# Patient Record
Sex: Male | Born: 1980 | Race: White | Hispanic: No | Marital: Single | Smoking: Current every day smoker
Health system: Southern US, Community
[De-identification: ages and names within clinical notes are randomized; demographics above are authoritative.]

## PROBLEM LIST (undated history)

## (undated) DIAGNOSIS — I10 Essential (primary) hypertension: Secondary | ICD-10-CM

## (undated) DIAGNOSIS — M5410 Radiculopathy, site unspecified: Secondary | ICD-10-CM

## (undated) DIAGNOSIS — M542 Cervicalgia: Secondary | ICD-10-CM

## (undated) DIAGNOSIS — M549 Dorsalgia, unspecified: Secondary | ICD-10-CM

## (undated) DIAGNOSIS — G8929 Other chronic pain: Secondary | ICD-10-CM

## (undated) HISTORY — PX: TRACHEOSTOMY: SUR1362

## (undated) HISTORY — PX: CARPAL TUNNEL RELEASE: SHX101

## (undated) HISTORY — PX: ELBOW SURGERY: SHX618

---

## 2006-06-16 ENCOUNTER — Emergency Department (HOSPITAL_COMMUNITY): Admission: EM | Admit: 2006-06-16 | Discharge: 2006-06-16 | Payer: Self-pay | Admitting: Emergency Medicine

## 2006-10-06 ENCOUNTER — Emergency Department (HOSPITAL_COMMUNITY): Admission: EM | Admit: 2006-10-06 | Discharge: 2006-10-06 | Payer: Self-pay | Admitting: Emergency Medicine

## 2006-10-12 ENCOUNTER — Emergency Department (HOSPITAL_COMMUNITY): Admission: EM | Admit: 2006-10-12 | Discharge: 2006-10-12 | Payer: Self-pay | Admitting: Emergency Medicine

## 2007-02-01 ENCOUNTER — Emergency Department (HOSPITAL_COMMUNITY): Admission: EM | Admit: 2007-02-01 | Discharge: 2007-02-01 | Payer: Self-pay | Admitting: Emergency Medicine

## 2007-02-03 ENCOUNTER — Emergency Department (HOSPITAL_COMMUNITY): Admission: EM | Admit: 2007-02-03 | Discharge: 2007-02-03 | Payer: Self-pay | Admitting: Emergency Medicine

## 2007-02-20 ENCOUNTER — Emergency Department (HOSPITAL_COMMUNITY): Admission: EM | Admit: 2007-02-20 | Discharge: 2007-02-20 | Payer: Self-pay | Admitting: Emergency Medicine

## 2007-03-05 ENCOUNTER — Emergency Department (HOSPITAL_COMMUNITY): Admission: EM | Admit: 2007-03-05 | Discharge: 2007-03-05 | Payer: Self-pay | Admitting: Emergency Medicine

## 2007-05-11 ENCOUNTER — Other Ambulatory Visit: Payer: Self-pay | Admitting: Emergency Medicine

## 2007-05-12 ENCOUNTER — Inpatient Hospital Stay (HOSPITAL_COMMUNITY): Admission: EM | Admit: 2007-05-12 | Discharge: 2007-05-13 | Payer: Self-pay | Admitting: Psychiatry

## 2007-05-14 ENCOUNTER — Ambulatory Visit: Payer: Self-pay | Admitting: Psychiatry

## 2007-07-21 ENCOUNTER — Emergency Department (HOSPITAL_COMMUNITY): Admission: EM | Admit: 2007-07-21 | Discharge: 2007-07-21 | Payer: Self-pay | Admitting: Emergency Medicine

## 2007-12-23 ENCOUNTER — Emergency Department (HOSPITAL_COMMUNITY): Admission: EM | Admit: 2007-12-23 | Discharge: 2007-12-23 | Payer: Self-pay | Admitting: Emergency Medicine

## 2008-05-26 ENCOUNTER — Emergency Department (HOSPITAL_COMMUNITY): Admission: EM | Admit: 2008-05-26 | Discharge: 2008-05-26 | Payer: Self-pay | Admitting: Emergency Medicine

## 2008-05-27 ENCOUNTER — Emergency Department (HOSPITAL_COMMUNITY): Admission: EM | Admit: 2008-05-27 | Discharge: 2008-05-27 | Payer: Self-pay | Admitting: Emergency Medicine

## 2008-05-31 ENCOUNTER — Emergency Department (HOSPITAL_COMMUNITY): Admission: EM | Admit: 2008-05-31 | Discharge: 2008-05-31 | Payer: Self-pay | Admitting: Emergency Medicine

## 2008-06-11 ENCOUNTER — Encounter: Payer: Self-pay | Admitting: Orthopedic Surgery

## 2008-11-03 ENCOUNTER — Ambulatory Visit (HOSPITAL_COMMUNITY): Admission: RE | Admit: 2008-11-03 | Discharge: 2008-11-03 | Payer: Self-pay | Admitting: Anesthesiology

## 2009-02-17 ENCOUNTER — Emergency Department (HOSPITAL_COMMUNITY): Admission: EM | Admit: 2009-02-17 | Discharge: 2009-02-17 | Payer: Self-pay | Admitting: Emergency Medicine

## 2009-02-23 ENCOUNTER — Emergency Department (HOSPITAL_COMMUNITY): Admission: EM | Admit: 2009-02-23 | Discharge: 2009-02-23 | Payer: Self-pay | Admitting: Emergency Medicine

## 2009-05-07 ENCOUNTER — Emergency Department (HOSPITAL_COMMUNITY): Admission: EM | Admit: 2009-05-07 | Discharge: 2009-05-07 | Payer: Self-pay | Admitting: Emergency Medicine

## 2009-06-23 ENCOUNTER — Emergency Department (HOSPITAL_COMMUNITY): Admission: EM | Admit: 2009-06-23 | Discharge: 2009-06-24 | Payer: Self-pay | Admitting: Emergency Medicine

## 2009-10-21 ENCOUNTER — Inpatient Hospital Stay (HOSPITAL_COMMUNITY): Admission: EM | Admit: 2009-10-21 | Discharge: 2009-10-23 | Payer: Self-pay | Admitting: Emergency Medicine

## 2009-10-22 ENCOUNTER — Ambulatory Visit: Payer: Self-pay | Admitting: Internal Medicine

## 2009-10-22 ENCOUNTER — Encounter: Payer: Self-pay | Admitting: Internal Medicine

## 2009-10-22 ENCOUNTER — Encounter (INDEPENDENT_AMBULATORY_CARE_PROVIDER_SITE_OTHER): Payer: Self-pay

## 2009-10-23 ENCOUNTER — Ambulatory Visit: Payer: Self-pay | Admitting: Internal Medicine

## 2009-10-27 ENCOUNTER — Encounter: Payer: Self-pay | Admitting: Internal Medicine

## 2010-03-23 NOTE — Letter (Signed)
Summary: CONSULTATION  CONSULTATION   Imported By: Rexene Alberts 10/22/2009 15:09:06  _____________________________________________________________________  External Attachment:    Type:   Image     Comment:   External Document

## 2010-03-23 NOTE — Letter (Signed)
Summary: Patient Notice, Endo Biopsy Results  [facility]  [address]   [place], [place] [address]   Phone: [phone]  Fax: [phone]       October 27, 2009   [facility] [address] [place], [place]  [address] 05/24/1980    Dear Mr. Steve Pearson,  I am pleased to inform you that the biopsies taken during your recent endoscopic examination did not show any evidence of cancer or infection upon pathologic examination.  There was mild inflammation.  Additional information/recommendations:  No further action is needed at this time.  Please follow-up with your primary care physician for your other healthcare needs.  Continue with the treatment plan as outlined on the day of your exam.  Please call us if you are having persistent problems or have questions about your condition that have not been fully answered at this time.  Sincerely,    R. Roetta Sessions MD, FACP [facility] Ph: [phone]   Fax: [phone]   Appended Document: Patient Notice, Endo Biopsy Results Letter mailed to pt.

## 2010-05-06 LAB — BASIC METABOLIC PANEL
CO2: 28 mEq/L (ref 19–32)
Chloride: 103 mEq/L (ref 96–112)
GFR calc Af Amer: >60 mL/min (ref >60)
Glucose, Bld: 95 mg/dL (ref 70–99)
Potassium: 3.8 mEq/L (ref 3.5–5.1)

## 2010-05-06 LAB — DIFFERENTIAL
Basophils Relative: 0 % (ref 0–1)
Eosinophils Absolute: 0.2 10*3/uL (ref 0.0–0.7)
Lymphs Abs: 2.1 10*3/uL (ref 0.7–4.0)
Monocytes Absolute: 0.9 10*3/uL (ref 0.1–1.0)
Monocytes Relative: 12 % (ref 3–12)

## 2010-05-06 LAB — APTT: aPTT: 39 seconds — ABNORMAL HIGH (ref 24–37)

## 2010-05-06 LAB — PROTIME-INR: INR: 1.08 (ref 0.00–1.49)

## 2010-05-06 LAB — CBC
MCH: 31.2 pg (ref 26.0–34.0)
MCHC: 33.8 g/dL (ref 30.0–36.0)
RBC: 3.96 MIL/uL — ABNORMAL LOW (ref 4.22–5.81)
WBC: 7.7 10*3/uL (ref 4.0–10.5)

## 2010-05-07 LAB — DIFFERENTIAL
Lymphs Abs: 2.4 10*3/uL (ref 0.7–4.0)
Monocytes Relative: 9 % (ref 3–12)
Neutro Abs: 8.4 10*3/uL — ABNORMAL HIGH (ref 1.7–7.7)
Neutrophils Relative %: 70 % (ref 43–77)

## 2010-05-07 LAB — URINE CULTURE
Colony Count: NO GROWTH
Culture  Setup Time: 201108312108

## 2010-05-07 LAB — URINALYSIS, ROUTINE W REFLEX MICROSCOPIC
Bilirubin Urine: NEGATIVE
Glucose, UA: NEGATIVE mg/dL
Hgb urine dipstick: NEGATIVE
Ketones, ur: NEGATIVE mg/dL
Leukocytes, UA: NEGATIVE
Specific Gravity, Urine: 1.025 (ref 1.005–1.030)
pH: 6.5 (ref 5.0–8.0)

## 2010-05-07 LAB — COMPREHENSIVE METABOLIC PANEL
ALT: 43 U/L (ref 0–53)
CO2: 27 mEq/L (ref 19–32)
Calcium: 9.4 mg/dL (ref 8.4–10.5)
Creatinine, Ser: 0.69 mg/dL (ref 0.4–1.5)
GFR calc Af Amer: >60 mL/min (ref >60)
Glucose, Bld: 95 mg/dL (ref 70–99)

## 2010-05-07 LAB — CBC
Hemoglobin: 13.4 g/dL (ref 13.0–17.0)
MCV: 91.6 fL (ref 78.0–100.0)
RDW: 12.7 % (ref 11.5–15.5)

## 2010-05-11 LAB — URINALYSIS, ROUTINE W REFLEX MICROSCOPIC
Ketones, ur: NEGATIVE mg/dL
Nitrite: NEGATIVE
Protein, ur: 30 mg/dL — AB
Specific Gravity, Urine: >1.03 — ABNORMAL HIGH (ref 1.005–1.030)
Urobilinogen, UA: 1 mg/dL (ref 0.0–1.0)

## 2010-05-11 LAB — URINE MICROSCOPIC-ADD ON

## 2010-05-11 LAB — CBC
HCT: 42.5 % (ref 39.0–52.0)
MCV: 90.6 fL (ref 78.0–100.0)
RBC: 4.69 MIL/uL (ref 4.22–5.81)
WBC: 9.4 10*3/uL (ref 4.0–10.5)

## 2010-05-11 LAB — DIFFERENTIAL
Basophils Absolute: 0.1 10*3/uL (ref 0.0–0.1)
Eosinophils Relative: 1 % (ref 0–5)
Lymphocytes Relative: 42 % (ref 12–46)

## 2010-05-11 LAB — LIPASE, BLOOD: Lipase: 31 U/L (ref 11–59)

## 2010-05-11 LAB — COMPREHENSIVE METABOLIC PANEL
AST: 21 U/L (ref 0–37)
BUN: 6 mg/dL (ref 6–23)
CO2: 28 mEq/L (ref 19–32)
Chloride: 104 mEq/L (ref 96–112)
Creatinine, Ser: 0.71 mg/dL (ref 0.4–1.5)
GFR calc non Af Amer: >60 mL/min (ref >60)
Total Bilirubin: 0.6 mg/dL (ref 0.3–1.2)

## 2010-06-02 LAB — URINALYSIS, ROUTINE W REFLEX MICROSCOPIC
Glucose, UA: NEGATIVE mg/dL
Hgb urine dipstick: NEGATIVE
Specific Gravity, Urine: 1.015 (ref 1.005–1.030)
pH: 7 (ref 5.0–8.0)

## 2010-06-02 LAB — GC/CHLAMYDIA PROBE AMP, GENITAL
Chlamydia, DNA Probe: NEGATIVE
GC Probe Amp, Genital: NEGATIVE

## 2010-06-12 ENCOUNTER — Emergency Department (HOSPITAL_COMMUNITY): Payer: Medicaid Other

## 2010-06-12 ENCOUNTER — Emergency Department (HOSPITAL_COMMUNITY)
Admission: EM | Admit: 2010-06-12 | Discharge: 2010-06-12 | Disposition: A | Discharge status: Home / Self Care | Payer: Medicaid Other | Attending: Emergency Medicine | Admitting: Emergency Medicine

## 2010-06-12 DIAGNOSIS — R109 Unspecified abdominal pain: Secondary | ICD-10-CM | POA: Insufficient documentation

## 2010-06-12 LAB — COMPREHENSIVE METABOLIC PANEL WITH GFR
ALT: 17 U/L (ref 0–53)
AST: 23 U/L (ref 0–37)
Albumin: 4.1 g/dL (ref 3.5–5.2)
Alkaline Phosphatase: 79 U/L (ref 39–117)
BUN: 8 mg/dL (ref 6–23)
CO2: 26 meq/L (ref 19–32)
Calcium: 9.2 mg/dL (ref 8.4–10.5)
Chloride: 98 meq/L (ref 96–112)
Creatinine, Ser: 0.67 mg/dL (ref 0.4–1.5)
GFR calc non Af Amer: >60 mL/min
Glucose, Bld: 92 mg/dL (ref 70–99)
Potassium: 3.7 meq/L (ref 3.5–5.1)
Sodium: 138 meq/L (ref 135–145)
Total Bilirubin: 0.7 mg/dL (ref 0.3–1.2)
Total Protein: 7.1 g/dL (ref 6.0–8.3)

## 2010-06-12 LAB — CBC
HCT: 43 % (ref 39.0–52.0)
Hemoglobin: 15 g/dL (ref 13.0–17.0)
MCH: 31.2 pg (ref 26.0–34.0)
MCHC: 34.9 g/dL (ref 30.0–36.0)
MCV: 89.4 fL (ref 78.0–100.0)
Platelets: 214 K/uL (ref 150–400)
RBC: 4.81 MIL/uL (ref 4.22–5.81)
RDW: 12.2 % (ref 11.5–15.5)
WBC: 11.9 K/uL — ABNORMAL HIGH (ref 4.0–10.5)

## 2010-06-12 LAB — DIFFERENTIAL
Basophils Absolute: 0.1 10*3/uL (ref 0.0–0.1)
Basophils Relative: 0 % (ref 0–1)
Lymphocytes Relative: 29 % (ref 12–46)
Neutro Abs: 7.2 10*3/uL (ref 1.7–7.7)
Neutrophils Relative %: 61 % (ref 43–77)

## 2010-06-12 LAB — LIPASE, BLOOD: Lipase: 90 U/L — ABNORMAL HIGH (ref 11–59)

## 2010-06-12 MED ORDER — IOHEXOL 300 MG/ML  SOLN: 100.0000 mL | Freq: Once | INTRAMUSCULAR | Status: DC | PRN
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
## 2010-11-11 LAB — URINALYSIS, ROUTINE W REFLEX MICROSCOPIC
Bilirubin Urine: NEGATIVE
Hgb urine dipstick: NEGATIVE
Ketones, ur: NEGATIVE
Nitrite: NEGATIVE
Specific Gravity, Urine: 1.01
pH: 7

## 2010-11-15 LAB — DIFFERENTIAL
Basophils Relative: 0
Eosinophils Absolute: 0.1
Eosinophils Relative: 1
Lymphs Abs: 2.5
Monocytes Absolute: 0.9
Monocytes Relative: 7
Neutrophils Relative %: 71

## 2010-11-15 LAB — URINALYSIS, ROUTINE W REFLEX MICROSCOPIC
Hgb urine dipstick: NEGATIVE
Nitrite: NEGATIVE
Protein, ur: NEGATIVE
Specific Gravity, Urine: 1.025
Urobilinogen, UA: 0.2

## 2010-11-15 LAB — BASIC METABOLIC PANEL
CO2: 29
Chloride: 98
Creatinine, Ser: 0.74
GFR calc Af Amer: >60
Potassium: 4
Sodium: 136

## 2010-11-15 LAB — CBC
HCT: 44.9
Hemoglobin: 15.9
MCHC: 35.5
MCV: 90.3
RBC: 4.97
WBC: 12.1 — ABNORMAL HIGH

## 2010-11-15 LAB — RAPID URINE DRUG SCREEN, HOSP PERFORMED
Amphetamines: NOT DETECTED
Barbiturates: NOT DETECTED
Benzodiazepines: NOT DETECTED
Tetrahydrocannabinol: POSITIVE — AB

## 2010-11-15 LAB — ETHANOL: Alcohol, Ethyl (B): <5

## 2011-02-22 ENCOUNTER — Emergency Department (HOSPITAL_COMMUNITY)
Admission: EM | Admit: 2011-02-22 | Discharge: 2011-02-22 | Disposition: A | Discharge status: Home / Self Care | Payer: Medicaid Other | Attending: Emergency Medicine | Admitting: Emergency Medicine

## 2011-02-22 ENCOUNTER — Emergency Department (HOSPITAL_COMMUNITY): Payer: Medicaid Other

## 2011-02-22 ENCOUNTER — Encounter: Payer: Self-pay | Admitting: *Deleted

## 2011-02-22 DIAGNOSIS — Y92009 Unspecified place in unspecified non-institutional (private) residence as the place of occurrence of the external cause: Secondary | ICD-10-CM | POA: Insufficient documentation

## 2011-02-22 DIAGNOSIS — S058X9A Other injuries of unspecified eye and orbit, initial encounter: Secondary | ICD-10-CM | POA: Insufficient documentation

## 2011-02-22 DIAGNOSIS — IMO0002 Reserved for concepts with insufficient information to code with codable children: Secondary | ICD-10-CM | POA: Insufficient documentation

## 2011-02-22 DIAGNOSIS — R51 Headache: Secondary | ICD-10-CM | POA: Insufficient documentation

## 2011-02-22 DIAGNOSIS — S0500XA Injury of conjunctiva and corneal abrasion without foreign body, unspecified eye, initial encounter: Secondary | ICD-10-CM

## 2011-02-22 DIAGNOSIS — I10 Essential (primary) hypertension: Secondary | ICD-10-CM | POA: Insufficient documentation

## 2011-02-22 HISTORY — DX: Essential (primary) hypertension: I10

## 2011-02-22 MED ORDER — CIPROFLOXACIN HCL 0.3 % OP SOLN
1.0000 [drp] | OPHTHALMIC | Status: AC
  Administered 2011-02-22: 1 [drp] via OPHTHALMIC
  Filled 2011-02-22: qty 2.5

## 2011-02-22 MED ORDER — OXYCODONE-ACETAMINOPHEN 5-325 MG PO TABS
2.0000 | ORAL_TABLET | Freq: Once | ORAL | Status: AC
  Administered 2011-02-22: 2 via ORAL
  Filled 2011-02-22: qty 2

## 2011-02-22 MED ORDER — TETRACAINE HCL 0.5 % OP SOLN
1.0000 [drp] | Freq: Once | OPHTHALMIC | Status: AC
  Administered 2011-02-22: 1 [drp] via OPHTHALMIC
  Filled 2011-02-22: qty 2

## 2011-02-22 MED ORDER — FLUORESCEIN SODIUM 1 MG OP STRP
1.0000 | ORAL_STRIP | Freq: Once | OPHTHALMIC | Status: AC
  Administered 2011-02-22: 1 via OPHTHALMIC
  Filled 2011-02-22: qty 1

## 2011-02-22 MED ORDER — CIPROFLOXACIN HCL 0.3 % OP SOLN
OPHTHALMIC | Status: AC
  Filled 2011-02-22: qty 2.5
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
## 2011-02-22 NOTE — ED Notes (Signed)
Pt attempted visual acuity screening.  Only able to test right eye.  Pt states eye hurts too much to attempt opening left eye.   Dr. Hyacinth Meeker aware.

## 2012-03-04 IMAGING — CT CT ORBITS W/O CM
3 of 7 series · 16 of 47 positions shown, 19 images · non-contrast
Comparison: 12/23/2007

CLINICAL DATA: Left eye pain.  With pain, redness, and light
sensitivity.

CT ORBITS WITHOUT CONTRAST
TECHNIQUE: Multidetector CT imaging of the orbits was performed
following the standard protocol without intravenous contrast.

[Series 8: orbit 0.75 spo cor · coronal · 0.22mm/px · 3 of 289 slices shown]
[im 73/289  bone]
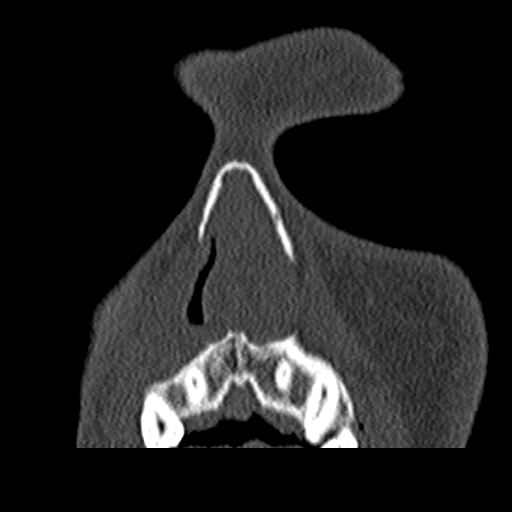
[im 145/289  bone]
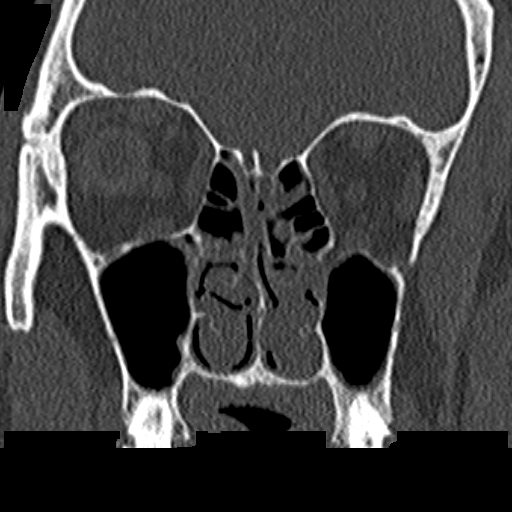
[im 217/289  bone]
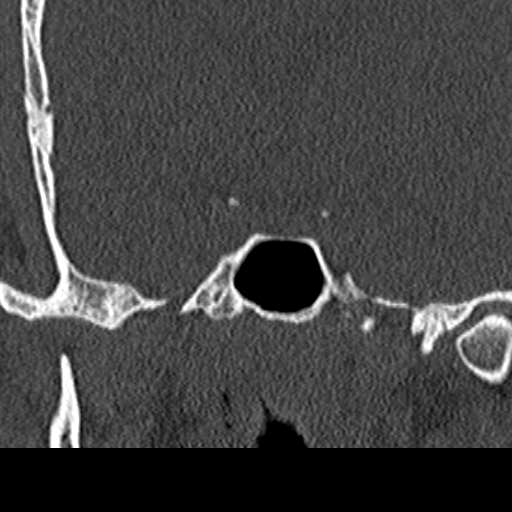

[Series 9: orbit 0.75 spo sag · sagittal · 0.30mm/px · 1 of 283 slices shown]
[im 142/283  bone]
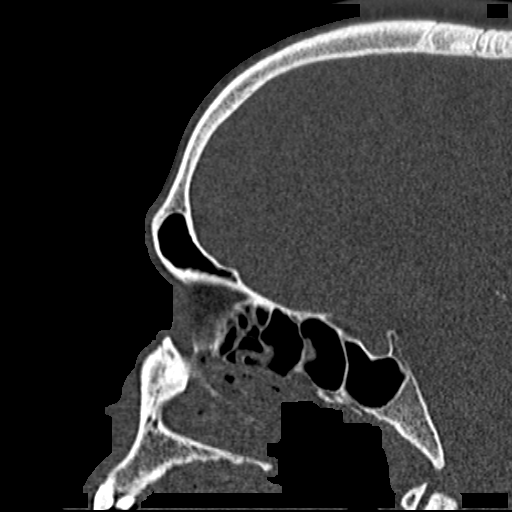

[Series 12: orbit 0.75 spo ax · axial · 0.28mm/px · z∈[+114,+244]mm · 12 of 304 slices shown, 15 images]
[im 22/304  brain]
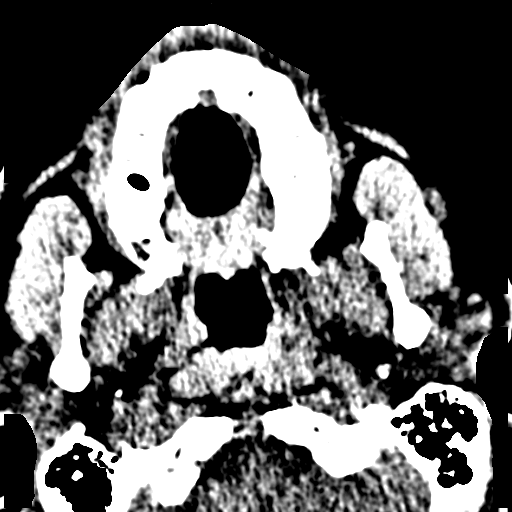
[im 22/304  bone]
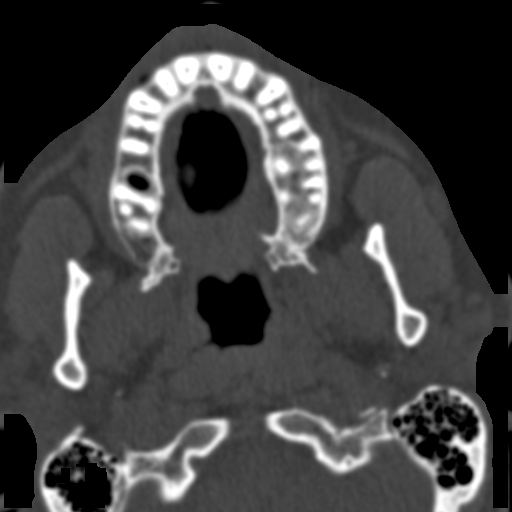
[im 44/304  bone]
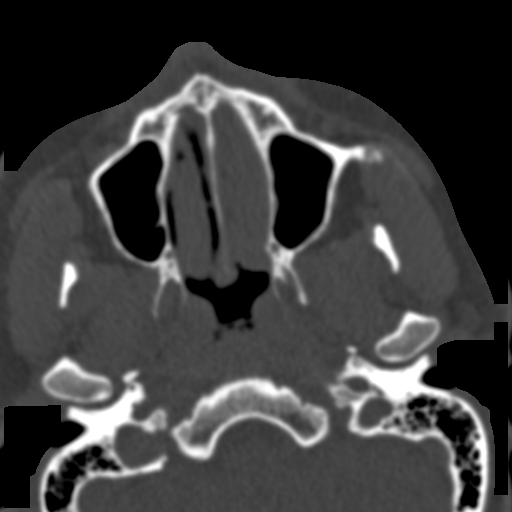
[im 65/304  bone]
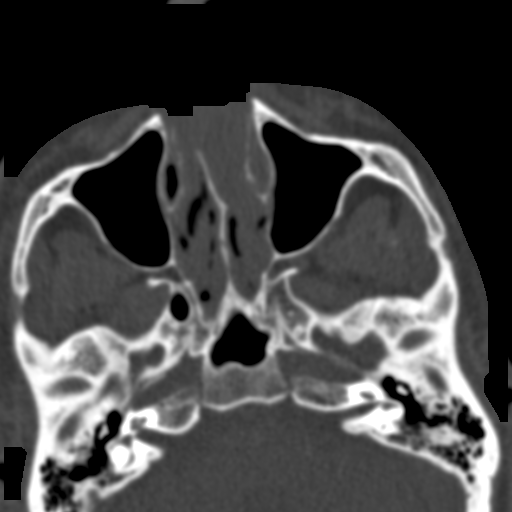
[im 87/304  bone]
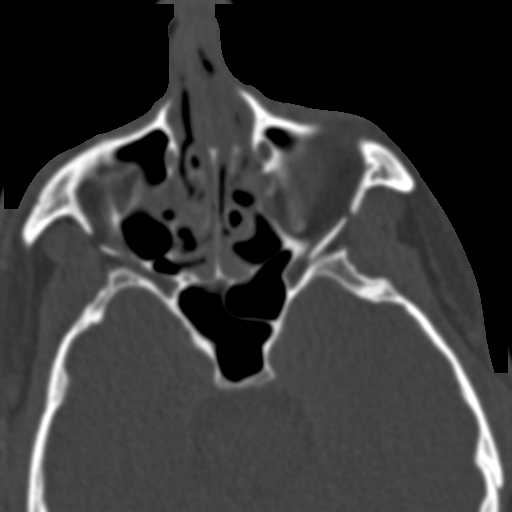
[im 109/304  brain]
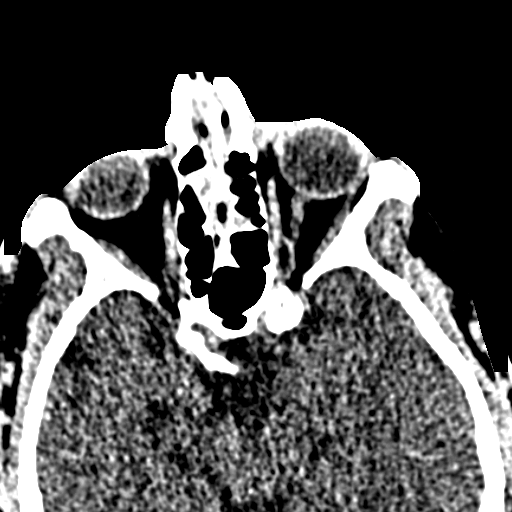
[im 109/304  bone]
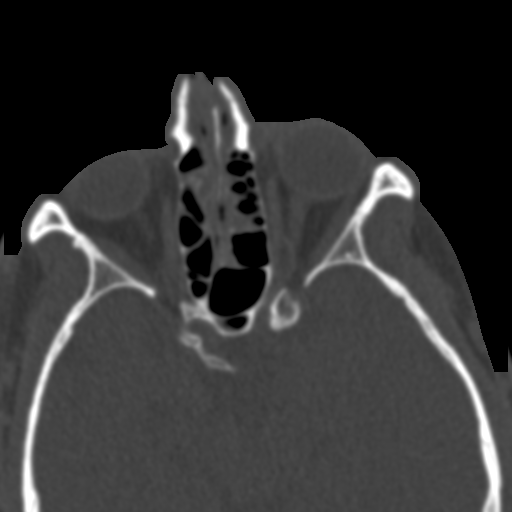
[im 130/304  bone]
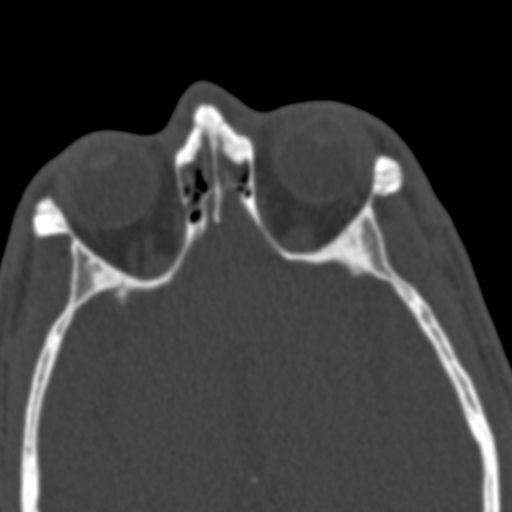
[im 174/304  bone]
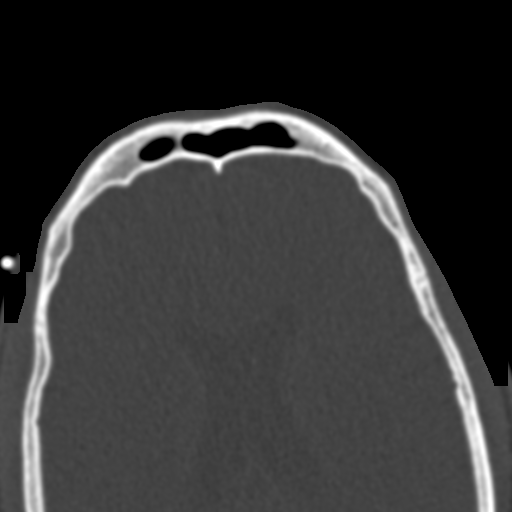
[im 195/304  bone]
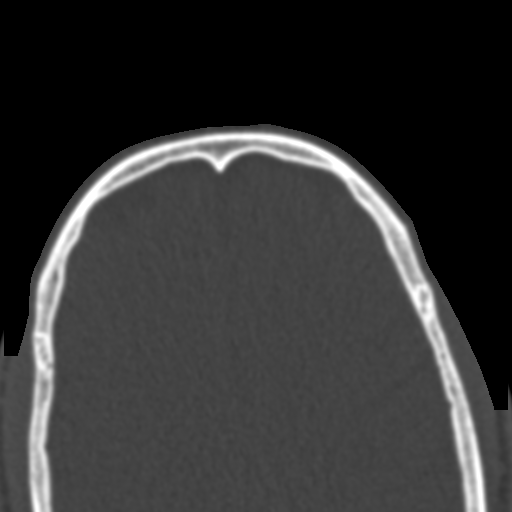
[im 217/304  brain]
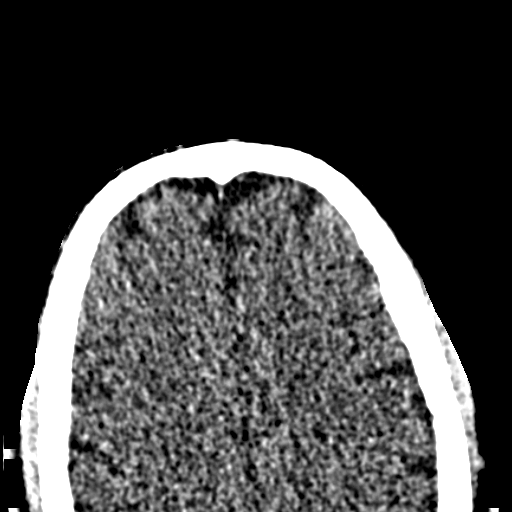
[im 217/304  bone]
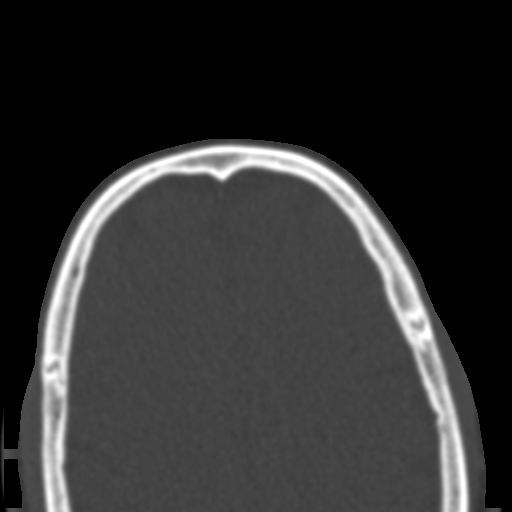
[im 239/304  bone]
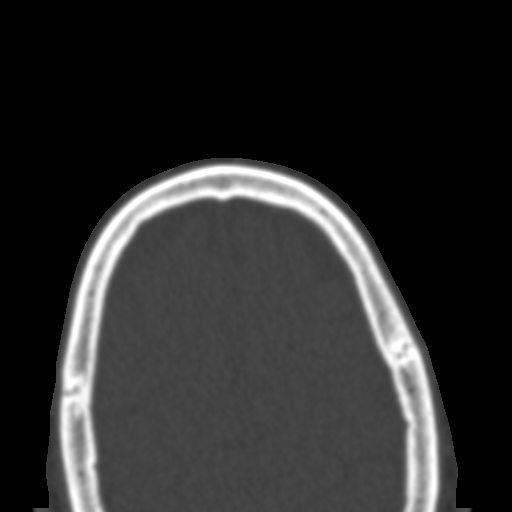
[im 260/304  bone]
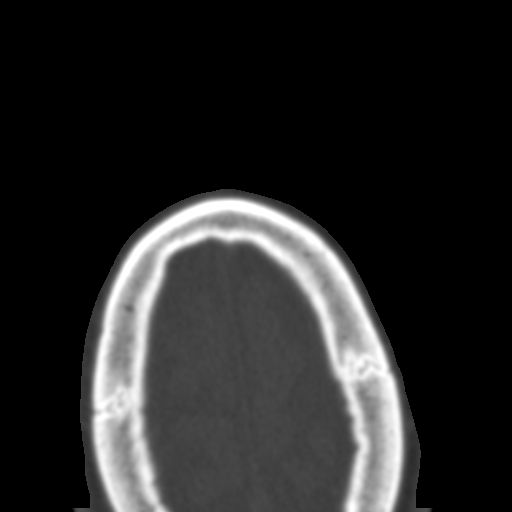
[im 282/304  bone]
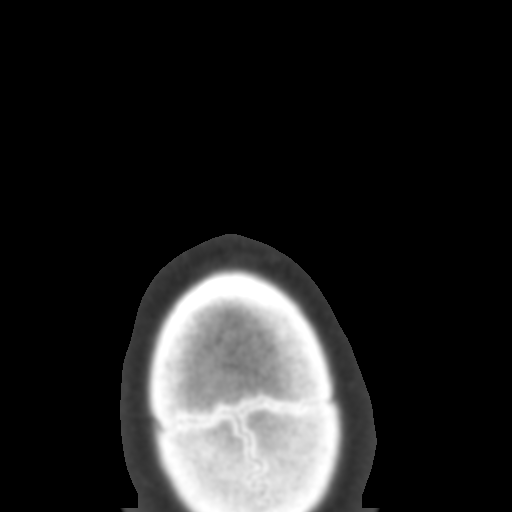

[16 of 47 positions shown; findings below may reference images not displayed]

FINDINGS: There is mild left periorbital soft tissue swelling.  No
evidence for preseptal or retrobulbar extension.  The globes and
extraocular muscles appear intact and symmetrical.  It is noted
that the pupils demonstrates somewhat asymmetric orientation which
might represent changes of strabismus.  Mucosal membrane thickening
in the maxillary antrum and ethmoid air cells as well as in the
right frontal sinus and sphenoid sinuses.  No acute air-fluid
levels are suggested.  Mildly depressed fractures of the left
inferior orbital wall without evidence of extraocular muscle
entrapment.  Suggestion of mild displacement of fracture of the
inferior lateral left orbital wall.  Lamina appreciable appear to
be intact.  These fractures appear to be old healed fractures and
were present on the previous study mEq phase.  The nasal bones and
nasal septum appear intact.  Zygomatic arches, pterygoid plates,
and temporomandibular joints appear intact.
IMPRESSION: Left periorbital hematoma without retrobulbar involvement.  Old
left orbital fractures.  No acute fractures demonstrated.
Inflammatory mucosal thickening in the paranasal sinuses.

## 2014-04-26 ENCOUNTER — Emergency Department (HOSPITAL_COMMUNITY): Payer: Medicaid Other

## 2014-04-26 ENCOUNTER — Encounter (HOSPITAL_COMMUNITY): Payer: Self-pay | Admitting: Emergency Medicine

## 2014-04-26 ENCOUNTER — Emergency Department (HOSPITAL_COMMUNITY)
Admission: EM | Admit: 2014-04-26 | Discharge: 2014-04-26 | Disposition: A | Discharge status: Home / Self Care | Payer: Medicaid Other | Attending: Emergency Medicine | Admitting: Emergency Medicine

## 2014-04-26 DIAGNOSIS — Z79899 Other long term (current) drug therapy: Secondary | ICD-10-CM | POA: Diagnosis not present

## 2014-04-26 DIAGNOSIS — G8929 Other chronic pain: Secondary | ICD-10-CM | POA: Insufficient documentation

## 2014-04-26 DIAGNOSIS — S42392G Other fracture of shaft of left humerus, subsequent encounter for fracture with delayed healing: Secondary | ICD-10-CM | POA: Diagnosis not present

## 2014-04-26 DIAGNOSIS — W1830XD Fall on same level, unspecified, subsequent encounter: Secondary | ICD-10-CM | POA: Insufficient documentation

## 2014-04-26 DIAGNOSIS — M79603 Pain in arm, unspecified: Secondary | ICD-10-CM

## 2014-04-26 DIAGNOSIS — S40022D Contusion of left upper arm, subsequent encounter: Secondary | ICD-10-CM | POA: Diagnosis not present

## 2014-04-26 DIAGNOSIS — I10 Essential (primary) hypertension: Secondary | ICD-10-CM | POA: Insufficient documentation

## 2014-04-26 DIAGNOSIS — Z720 Tobacco use: Secondary | ICD-10-CM | POA: Insufficient documentation

## 2014-04-26 DIAGNOSIS — Z8739 Personal history of other diseases of the musculoskeletal system and connective tissue: Secondary | ICD-10-CM | POA: Diagnosis not present

## 2014-04-26 DIAGNOSIS — S4992XD Unspecified injury of left shoulder and upper arm, subsequent encounter: Secondary | ICD-10-CM | POA: Diagnosis present

## 2014-04-26 DIAGNOSIS — S42302G Unspecified fracture of shaft of humerus, left arm, subsequent encounter for fracture with delayed healing: Secondary | ICD-10-CM

## 2014-04-26 HISTORY — DX: Cervicalgia: M54.2

## 2014-04-26 HISTORY — DX: Dorsalgia, unspecified: M54.9

## 2014-04-26 HISTORY — DX: Other chronic pain: G89.29

## 2014-04-26 HISTORY — DX: Radiculopathy, site unspecified: M54.10

## 2014-04-26 MED ORDER — KETOROLAC TROMETHAMINE 60 MG/2ML IM SOLN
60.0000 mg | Freq: Once | INTRAMUSCULAR | Status: AC
  Administered 2014-04-26: 60 mg via INTRAMUSCULAR
  Filled 2014-04-26: qty 2

## 2014-04-26 MED ORDER — OXYCODONE-ACETAMINOPHEN 5-325 MG PO TABS: 1.0000 | ORAL_TABLET | ORAL | Status: AC | PRN

## 2014-04-26 MED ORDER — OXYCODONE-ACETAMINOPHEN 5-325 MG PO TABS
2.0000 | ORAL_TABLET | Freq: Once | ORAL | Status: AC
  Administered 2014-04-26: 2 via ORAL
  Filled 2014-04-26: qty 2

## 2014-04-26 NOTE — ED Provider Notes (Signed)
CSN: [id number]     Arrival date & time 04/26/14  1313 History   First MD Initiated Contact with Patient 04/26/14 1409     Chief Complaint  Patient presents with  . Arm Pain     (Consider location/radiation/quality/duration/timing/severity/associated sxs/prior Treatment) The history is provided by the patient and a relative.   Steve Pearson is a 34 y.o. male presenting with left arm fracture which occurred 6 days ago.  [facility] where he had xrays confirming a humeral fracture occurred when he fell off a porch as he was reaching to catch a football.  He was placed in a splint and sling and has complaint of ongoing pain, worsened with movement of the arm.  He denies being given referrals for this injury for followup care.  Brother at bedside confirms this history.  His pain is constant, but worse with movement of the forearm.  He denies weakness or new numbness distal to the injury but does have chronic decreased sensation of his left dorsal hand from a saw blade injury years ago.  He has been taking ibuprofen without relief of pain.  He has a history of substance abuse and is currently on suboxone, his last dose taken yesterday.    Past Medical History  Diagnosis Date  . Hypertension   . Chronic back pain   . Chronic neck pain   . Radiculopathy    Past Surgical History  Procedure Laterality Date  . Carpal tunnel release    . Tracheostomy    . Elbow surgery     History reviewed. No pertinent family history. History  Substance Use Topics  . Smoking status: Current Every Day Smoker  . Smokeless tobacco: Not on file  . Alcohol Use: No    Review of Systems  Constitutional: Negative for fever.  Musculoskeletal: Positive for arthralgias. Negative for myalgias.  Skin: Positive for color change.  Neurological: Negative for weakness and numbness.      Allergies  Review of patient's allergies indicates no known allergies.  Home Medications    Prior to Admission medications   Medication Sig Start Date End Date Taking? Authorizing Provider  ALPRAZolam Prudy Feeler(XANAX) 1 MG tablet Take 1 mg by mouth 3 (three) times daily. 04/19/14  Yes Historical Provider, MD  ibuprofen (ADVIL,MOTRIN) 800 MG tablet Take 800 mg by mouth daily as needed for moderate pain.   Yes Historical Provider, MD  SUBOXONE 8-2 MG FILM Place 1 strip under the tongue 3 (three) times daily. 04/15/14  Yes Historical Provider, MD  oxyCODONE-acetaminophen (PERCOCET/ROXICET) 5-325 MG per tablet Take 1 tablet by mouth every 4 (four) hours as needed. 04/26/14   Burgess AmorJulie Xana Bradt, PA-C   BP 150/86 mmHg  Pulse 93  Temp(Src) 98 F (36.7 C) (Oral)  Resp 18  Ht 5\' 5"  (1.651 m)  Wt 185 lb (83.915 kg)  BMI 30.79 kg/m2  SpO2 99% Physical Exam  Constitutional: He appears well-developed and well-nourished.  HENT:  Head: Atraumatic.  Neck: Normal range of motion.  Cardiovascular:  Pulses:      Radial pulses are 2+ on the right side, and 2+ on the left side.  Pulses equal bilaterally  Musculoskeletal: He exhibits edema and tenderness.       Left upper arm: He exhibits bony tenderness and swelling. He exhibits no laceration.  Bruising of left upper arm, skin is soft and intact.   Neurological: He is alert. He has normal strength. He displays normal reflexes. No sensory deficit.  Skin: Skin is warm and dry.  Psychiatric: He has a normal mood and affect.    ED Course  Procedures (including critical care time) Labs Review Labs Reviewed - No data to display  Imaging Review Dg Elbow 2 Views Left  04/26/2014   CLINICAL DATA:  Fall off porch 6 days ago. Elbow pain. Left humerus fracture. Initial encounter.  EXAM: LEFT ELBOW - 2 VIEW  COMPARISON:  Left humerus radiographs also obtained today  FINDINGS: No evidence of acute fracture or dislocation involving the elbow joint. 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 Surgical pin seen within the medial condyle. Left humeral shaft fracture  is noted as better visualized on separate left humerus radiographs.  IMPRESSION: No evidence of left elbow fracture or dislocation.  Advanced osteoarthritis.  Left humeral shaft fracture.   Electronically Signed   By: Myles Rosenthal M.D.   On: 04/26/2014 17:14   Dg Humerus Left  04/26/2014   CLINICAL DATA:  Larey Seat from porch 6 days ago, arm pain  EXAM: LEFT HUMERUS - 2+ VIEW  COMPARISON:  None.  FINDINGS: There is a fracture through the midshaft of the humerus which is mildly comminuted. There is moderate apex lateral angulation. Distal fracture fragment is displaced approximately half shafts width medially.  IMPRESSION: Fracture humeral shaft.   Electronically Signed   By: Esperanza Heir M.D.   On: 04/26/2014 15:25     EKG Interpretation None      MDM   Final diagnoses:  Humerus shaft fracture, left, with delayed healing, subsequent encounter    Discussed patients injury with Dr. Romeo Apple who will see pt in office, but would prefer he be seen by the orthopedist he was originally referred to in [place].  Will attempt to get this information from [place].   Patients labs and/or radiological studies were reviewed and considered during the medical decision making and disposition process.  Results were also discussed with patient. Oxycodone given.  Will prescribe small quantity - advised to hold suboxone while on this pain medicine.  He was provided a new sling with immobilizer for better protection.  Advised to call the orthopedist on Monday for close f/u this week.  Discussed with Dr. Judd Lien prior to dc home.    Burgess Amor, PA-C 04/26/14 1836  Geoffery Lyons, MD 04/27/14 [phone]

## 2014-04-26 NOTE — ED Notes (Signed)
Per patient fractured upper humerus 7 days ago, [facility] and had splint and sling applied. Per patient Ottis Stain[pain is worse and "he feels clicking sensation in arm every time he moves it." Per patient was given prescription for percocet but patient takes suboxone so he did not get it filled and states "pain medications not going to do me any good. I just need this splint fixed."

## 2014-04-26 NOTE — Discharge Instructions (Signed)
It is important for you to followup with an orthopedist.  This injury will not heal without surgery.  Refer to the information for this from the paperwork you were given by [facility].  You may take the oxycodone for pain.  Do not use your suboxone while you are taking this medicine.  Get rechecked immediately for any numbness in your hand or forearm, worse pain or changes in color of your hand (purple, blue or pale).

## 2014-04-27 NOTE — ED Provider Notes (Signed)
8:30 AM  Pt called this am for referral info, states tried last night, unable to get through.  Records obtained from [facility] - f/u Dr. Andi HenceJoseph Campbell,  [phone]  [address] [place] [address], [place]  Info given to pt who will in am for appt.  Burgess AmorJulie Henny Strauch, PA-C 04/27/14 [id number]  Geoffery Lyonsouglas Delo, MD 04/27/14 [phone]

## 2014-04-28 ENCOUNTER — Telehealth: Payer: Self-pay | Admitting: Orthopedic Surgery

## 2014-04-28 NOTE — Telephone Encounter (Signed)
Patient stopped in office in person, prior to lunchtime today, 04/28/14, to again request appointment.  He hadn't yet picked up Xray films or reports from [facility] as advised.  He presented his insurance card which indicates that his plan with Medicaid is [place] [employer], which requires primary care referral.  He stated that there was no primary care listed on card, however, [facility].  We again relayed the need for referral from primary care provider prior, and patient abruptly left office without giving us another opportunity to re-advise regarding referral, which I had initially been trying to assist patient with.

## 2014-04-28 NOTE — Telephone Encounter (Signed)
Call received from patient, requests appointment for problem of fracture of humerus "happened 11 days ago" and following Emergency Room visits at both [facility], initially, and [facility].  Reviewed information regarding insurance and protocol regarding appointment referral from primary care.  Awaiting also, on records and film from [place], which patient is working on obtaining. His ph# is [phone]

## 2014-04-29 NOTE — Telephone Encounter (Signed)
04/29/14 Referral received from primary care; spoke with patient; appointment scheduled (offered today, 04/29/14) however patient states he has an appointment in [place] this afternoon; therefore, unable to come; scheduled first available. Aware of appointment.

## 2014-05-01 ENCOUNTER — Ambulatory Visit: Payer: Medicaid Other | Admitting: Orthopedic Surgery

## 2014-05-01 ENCOUNTER — Encounter: Payer: Self-pay | Admitting: Orthopedic Surgery

## 2015-05-07 IMAGING — DX DG HUMERUS 2V *L*
2 series · 2 of 2 positions shown · non-contrast
Comparison: None.

CLINICAL DATA: Fell from porch 6 days ago, arm pain

EXAM:
LEFT HUMERUS - 2+ VIEW

[humerus ap]
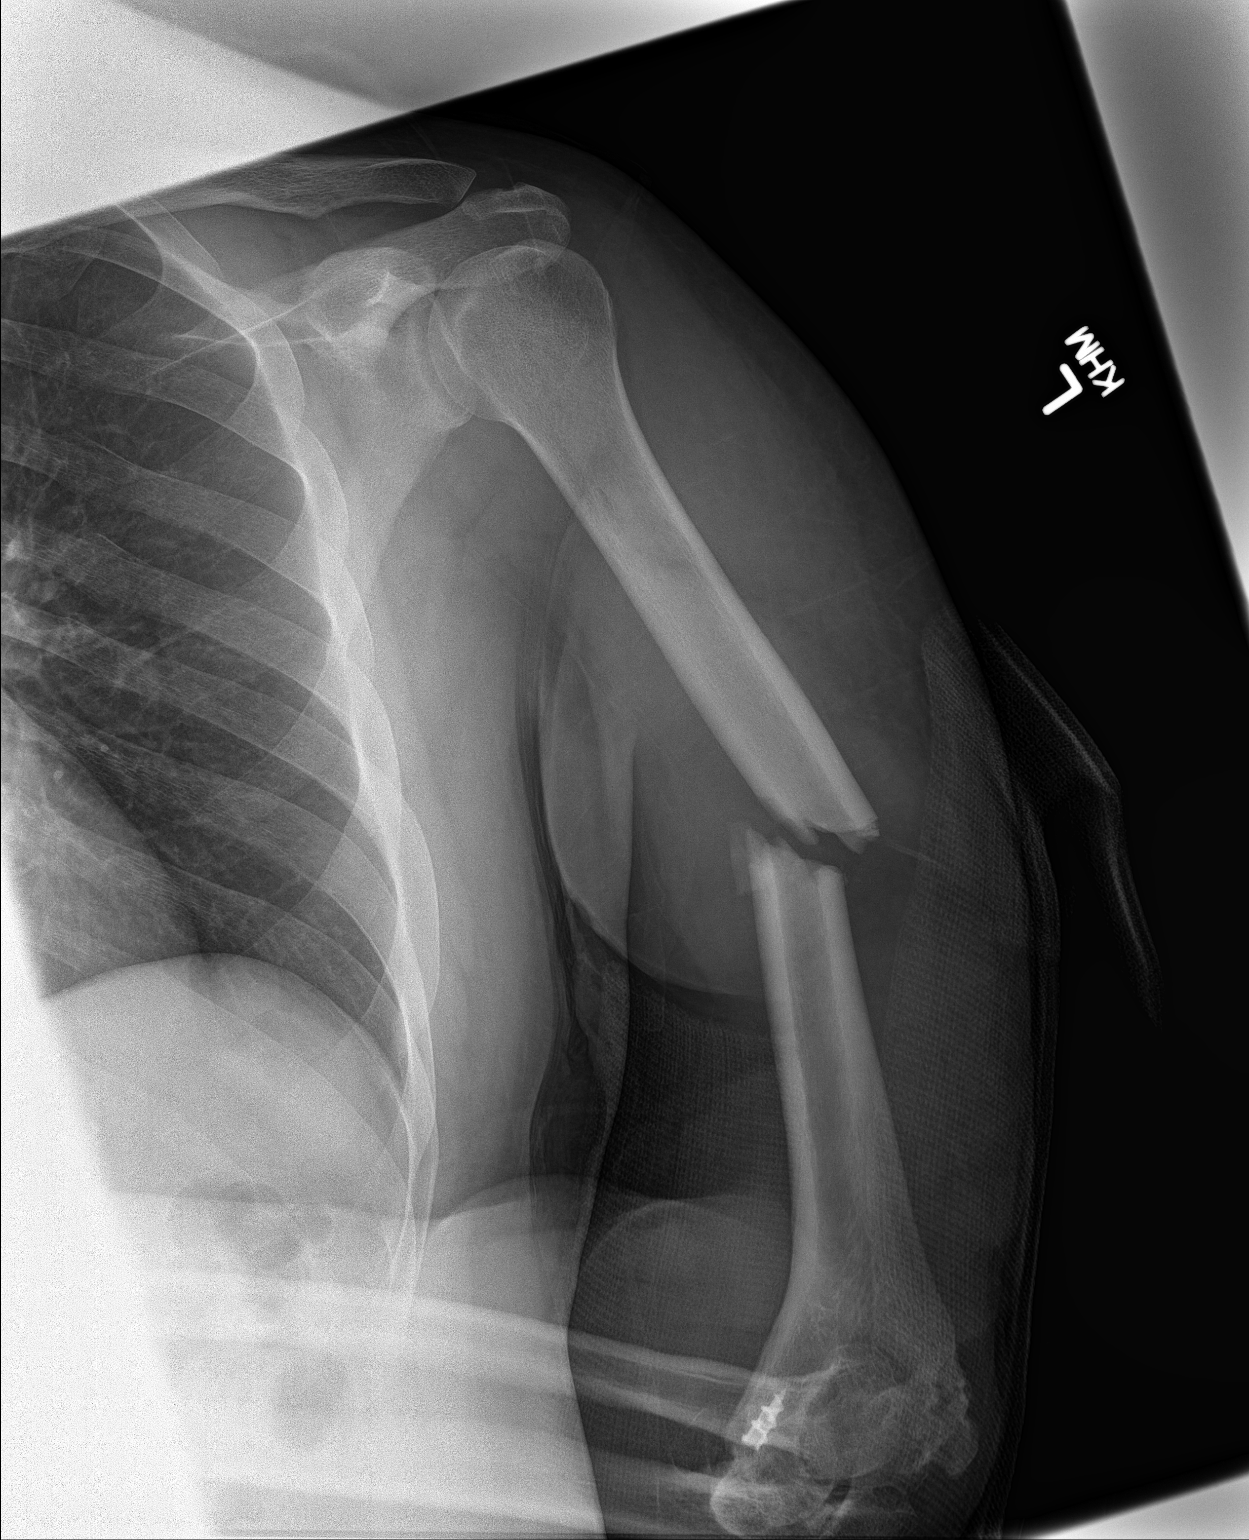

[humerus lat]
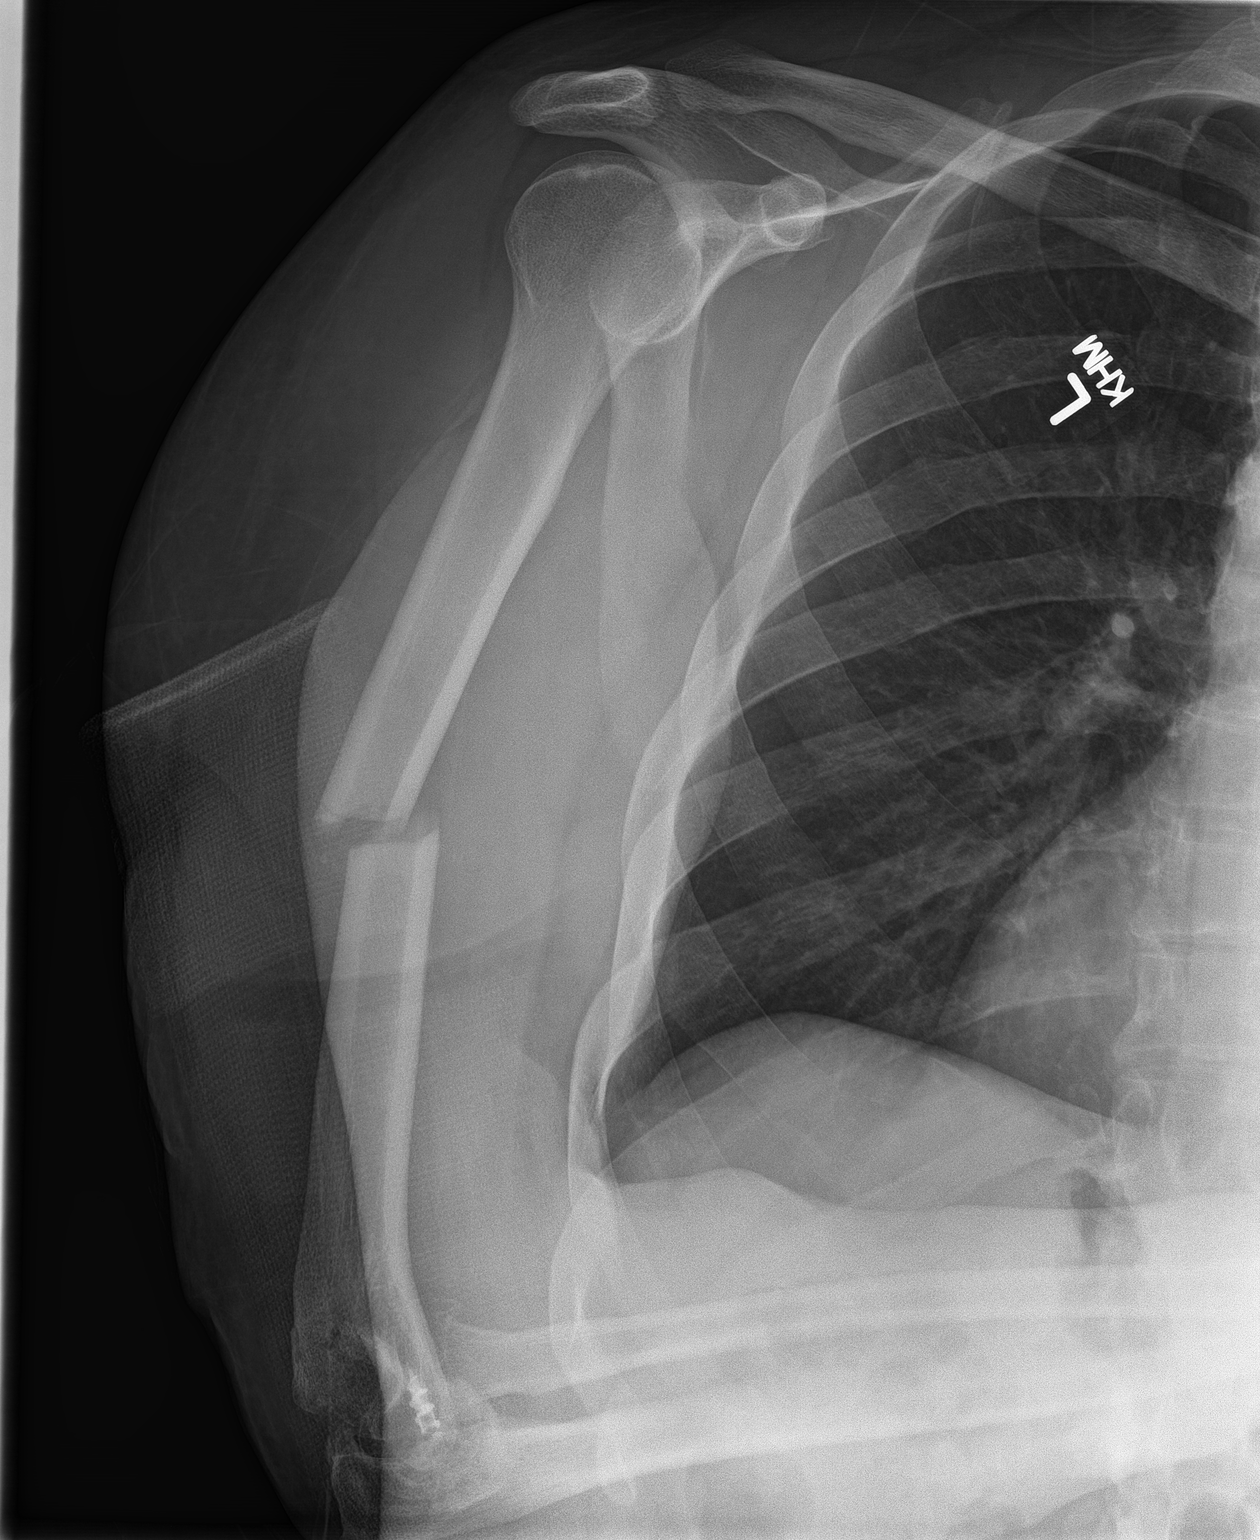

[2 of 2 positions shown; findings below may reference images not displayed]

FINDINGS: There is a fracture through the midshaft of the humerus which is
mildly comminuted. There is moderate apex lateral angulation. Distal
fracture fragment is displaced approximately half shafts width
medially.
IMPRESSION: Fracture humeral shaft.
